# Patient Record
Sex: Female | Born: 1965 | Race: White | Hispanic: Yes | Marital: Married | Smoking: Never smoker
Health system: Southern US, Community
[De-identification: ages and names within clinical notes are randomized; demographics above are authoritative.]

## PROBLEM LIST (undated history)

## (undated) HISTORY — PX: TUBAL LIGATION: SHX77

---

## 2013-01-15 ENCOUNTER — Ambulatory Visit: Payer: Self-pay

## 2013-01-15 ENCOUNTER — Ambulatory Visit: Payer: Self-pay | Admitting: Family Medicine

## 2013-01-15 VITALS — BP 128/72 | HR 81 | Temp 98.0°F | Resp 17 | Ht 61.5 in | Wt 116.0 lb

## 2013-01-15 DIAGNOSIS — R1032 Left lower quadrant pain: Secondary | ICD-10-CM

## 2013-01-15 LAB — COMPREHENSIVE METABOLIC PANEL
AST: 15 U/L (ref 0–37)
Albumin: 4.5 g/dL (ref 3.5–5.2)
BUN: 16 mg/dL (ref 6–23)
Calcium: 9 mg/dL (ref 8.4–10.5)
Chloride: 101 mEq/L (ref 96–112)
Creat: 0.46 mg/dL — ABNORMAL LOW (ref 0.50–1.10)
Glucose, Bld: 84 mg/dL (ref 70–99)
Total Bilirubin: 0.4 mg/dL (ref 0.3–1.2)

## 2013-01-15 LAB — COMPREHENSIVE METABOLIC PANEL WITH GFR
ALT: 10 U/L (ref 0–35)
Alkaline Phosphatase: 52 U/L (ref 39–117)
CO2: 25 meq/L (ref 19–32)
Potassium: 4.5 meq/L (ref 3.5–5.3)
Sodium: 135 meq/L (ref 135–145)
Total Protein: 7.7 g/dL (ref 6.0–8.3)

## 2013-01-15 LAB — POCT CBC
Granulocyte percent: 59.5 % (ref 37–80)
HCT, POC: 39.1 % (ref 37.7–47.9)
Hemoglobin: 12.1 g/dL — AB (ref 12.2–16.2)
Lymph, poc: 2.1 (ref 0.6–3.4)
MCH, POC: 28.9 pg (ref 27–31.2)
MCHC: 30.9 g/dL — AB (ref 31.8–35.4)
MCV: 93.3 fL (ref 80–97)
MID (cbc): 0.5 (ref 0–0.9)
MPV: 10.7 fL (ref 0–99.8)
POC Granulocyte: 3.7 (ref 2–6.9)
POC LYMPH PERCENT: 33.3 %L (ref 10–50)
POC MID %: 7.2 % (ref 0–12)
Platelet Count, POC: 214 10*3/uL (ref 142–424)
RBC: 4.19 M/uL (ref 4.04–5.48)
RDW, POC: 14 %
WBC: 6.3 10*3/uL (ref 4.6–10.2)

## 2013-01-15 LAB — POCT UA - MICROSCOPIC ONLY
Bacteria, U Microscopic: NEGATIVE
Casts, Ur, LPF, POC: NEGATIVE
Crystals, Ur, HPF, POC: NEGATIVE
Mucus, UA: NEGATIVE
WBC, Ur, HPF, POC: NEGATIVE
Yeast, UA: NEGATIVE

## 2013-01-15 LAB — POCT URINALYSIS DIPSTICK
Bilirubin, UA: NEGATIVE
Glucose, UA: NEGATIVE
Ketones, UA: NEGATIVE
Leukocytes, UA: NEGATIVE
Nitrite, UA: NEGATIVE
Protein, UA: NEGATIVE
Spec Grav, UA: 1.025
Urobilinogen, UA: 0.2
pH, UA: 6

## 2013-01-15 NOTE — Progress Notes (Signed)
Chief Complaint:  Chief Complaint  Patient presents with  . Abdominal Pain  . Headache  . Back Pain    HPI: Michele Gallagher is a 48 y.o. female who is here for LLQ abd pain x 10 years, when the pain is very strong she feels nauseated but no vomitus. No fevers or chills or dysuria.  Last pap was a long time ago. She has never had a pap here, all her 4 children were born [place]. She has pain in LLQ abd pain when she has to strain, whenever she has a BM she feels better. She has had constipation regularly.  Denies any unintentional weight loss. Pain worse with constipation. The pain never goes away. She always shas pain there. Sometimes it is worse, never gets better.  She has a throbbing pain. It gets worse after eating, specially spicy food. She has constant pain, does not radiate. Rarely radiates to left leg and cannot walk about 2x per year.. Denies numbness or tingling.  Lower back also hurts on both sides. Sometimes she feels she might have kidney pain.  Ha due to being worried about pain   History reviewed. No pertinent past medical history. History reviewed. No pertinent past surgical history. History   Social History  . Marital Status: Single    Spouse Name: N/A    Number of Children: N/A  . Years of Education: N/A   Social History Main Topics  . Smoking status: Never Smoker   . Smokeless tobacco: None  . Alcohol Use: None  . Drug Use: None  . Sexual Activity: None   Other Topics Concern  . None   Social History Narrative  . None   History reviewed. No pertinent family history. No Known Allergies Prior to Admission medications   Not on File     ROS: The patient denies fevers, chills, night sweats, unintentional weight loss, chest pain, palpitations, wheezing, dyspnea on exertion, nausea, vomiting, dysuria, hematuria, melena, numbness, weakness, or tingling.   All other systems have been reviewed and were otherwise negative with the exception  of those mentioned in the HPI and as above.    PHYSICAL EXAM: Filed Vitals:   01/15/13 0916  BP: 128/72  Pulse: 81  Temp: 98 F (36.7 C)  Resp: 17   Filed Vitals:   01/15/13 0916  Height: 5' 1.5" (1.562 m)  Weight: 116 lb (52.617 kg)   Body mass index is 21.57 kg/(m^2).  General: Alert, no acute distress HEENT:  Normocephalic, atraumatic, oropharynx patent. EOMI, PERRLA Cardiovascular:  Regular rate and rhythm, no rubs murmurs or gallops.  No Carotid bruits, radial pulse intact. No pedal edema.  Respiratory: Clear to auscultation bilaterally.  No wheezes, rales, or rhonchi.  No cyanosis, no use of accessory musculature GI: No organomegaly, abdomen is soft and minimal to mild LLQ abd-tenderness, positive bowel sounds.  No masses. Skin: No rashes. Neurologic: Facial musculature symmetric. Psychiatric: Patient is appropriate throughout our interaction. Lymphatic: No cervical lymphadenopathy Musculoskeletal: Gait intact. Pelvic exam normal + LLQ abd pain  LABS: Results for orders placed in visit on 01/15/13  POCT CBC      Result Value Range   WBC 6.3  4.6 - 10.2 K/uL   Lymph, poc 2.1  0.6 - 3.4   POC LYMPH PERCENT 33.3  10 - 50 %L   MID (cbc) 0.5  0 - 0.9   POC MID % 7.2  0 - 12 %M   POC Granulocyte 3.7  2 - 6.9   Granulocyte percent 59.5  37 - 80 %G   RBC 4.19  4.04 - 5.48 M/uL   Hemoglobin 12.1 (*) 12.2 - 16.2 g/dL   HCT, POC 16.1  09.6 - 47.9 %   MCV 93.3  80 - 97 fL   MCH, POC 28.9  27 - 31.2 pg   MCHC 30.9 (*) 31.8 - 35.4 g/dL   RDW, POC 04.5     Platelet Count, POC 214  142 - 424 K/uL   MPV 10.7  0 - 99.8 fL  POCT UA - MICROSCOPIC ONLY      Result Value Range   WBC, Ur, HPF, POC neg     RBC, urine, microscopic 0-1     Bacteria, U Microscopic neg     Mucus, UA neg     Epithelial cells, urine per micros 3-5     Crystals, Ur, HPF, POC neg     Casts, Ur, LPF, POC neg     Yeast, UA neg    POCT URINALYSIS DIPSTICK      Result Value Range   Color, UA yellow      Clarity, UA clear     Glucose, UA neg     Bilirubin, UA neg     Ketones, UA neg     Spec Grav, UA 1.025     Blood, UA trace-lysed     pH, UA 6.0     Protein, UA neg     Urobilinogen, UA 0.2     Nitrite, UA neg     Leukocytes, UA Negative       EKG/XRAY:   Primary read interpreted by Dr. Conley Rolls at [facility]. No free air, no obvious stones, nonspecific gas patterns + mild stool burden   ASSESSMENT/PLAN: Encounter Diagnosis  Name Primary?  Marland Kitchen LLQ pain Yes   Miralax for possible constipation  Refer to HD for pap and STD testing If no improvement then consider pelvic US for ovarian related issues Fu prn  Gross sideeffects, risk and benefits, and alternatives of medications d/w patient. Patient is aware that all medications have potential sideeffects and we are unable to predict every sideeffect or drug-drug interaction that may occur.  Michele Pacitti PHUONG, DO 01/15/2013 10:34 AM

## 2013-01-15 NOTE — Patient Instructions (Addendum)
Dolor abdominal en las mujeres °(Abdominal Pain, Women) °El dolor abdominal (en el estómago, la pelvis o el vientre) puede tener muchas causas. Es importante que Michele Gallagher informe a su médico: °· La ubicación del dolor. °· ¿Viene y se va, o persiste todo el tiempo? °· ¿Hay situaciones que inician el dolor (comer ciertos alimentos, la actividad física)? °· ¿Tiene otros síntomas asociados al dolor (fiebre, náuseas, vómitos, diarrea)? °Todo es de gran ayuda cuando se trata de hallar la causa del dolor. °CAUSAS °· Estómago: Infecciones por virus o bacterias, o úlcera. °· Intestino: Apendicitis (apéndice inflamado), ileitis regional (enfermedad de Crohn), colitis ulcerosa (colon inflamado), síndrome del colon irritable, diverticulitis (inflamación de los divertículos del colon) o cáncer de estómago oo intestino. °· Enfermedades de la vesícula biliar o cálculos. °· Enfermedades renales, cálculos o infecciones en el riñón. °· Infección o cáncer del páncreas. °· Fibromialgia (trastorno doloroso) °· Enfermedades de los órganos femeninos: °¨ Uterus: Útero: fibroma (tumor no canceroso) o infección °¨ Trompas de Falopio: infección o embarazo ectópico °¨ En los ovarios, quistes o tumores. °¨ Adherencias pélvicas (tejido cicatrizal). °¨ Endometriosis (el tejido que cubre el útero se desarrolla en la pelvis y los órganos pélvicos). °¨ Síndrome de congestión pélvica (los órganos femeninos se llenan de sangre antes del periodo menstrual( °¨ Dolor durante el periodo menstrual. °¨ Dolor durante la ovulación (al producir óvulos). °¨ Dolor al usar el DIU (dispositivo intrauterino para el control de la natalidad) °¨ Cáncer en los órganos femeninos. °· Dolor funcional (no está originado en una enfermedad, puede mejorar sin tratamiento). °· Dolor de origen psicológico °· Depresión. °DIAGNÓSTICO °Su médico decidirá la gravedad del dolor a través del examen físico °· Análisis de sangre °· Radiografías °· Ecografías °· TC (tomografía computada, tipo  especial de radiografías). °· IMR (resonancia magnética) °· Cultivos, en el caso una infección °· Colon por enema de bario (se inserta una sustancia de contraste en el intestino grueso para mejorar la observación con rayos X.) °· Colonoscopía (observación del intestino con un tubo luminoso). °· Laparoscopía (examen del interior del abdomen con un tubo que tiene una luz). °· Cirugía exploratoria abdominal mayor (se observa el abdomen realizando una gran incisión). °TRATAMIENTO °El tratamiento dependerá de la causa del problema.  °· Muchos de estos casos pueden controlarse y tratarse en casa. °· Medicamentos de venta libre indicados por el médico. °· Medicamentos con receta. °· Antibióticos, en caso de infección °· Píldoras anticonceptivas, en el caso de períodos dolorosos o dolor al ovular. °· Tratamiento hormonal, para la endometriosis °· Inyecciones para bloqueo nervioso selectivo. °· Fisioterapia. °· Antidepresivos. °· Consejos por parte de un psícólogo o psiquiatra. °· Cirugía mayor o menor. °INSTRUCCIONES PARA EL CUIDADO DOMICILIARIO °· No tome ni administre laxantes a menos que se lo haya indicado su médico. °· Tome analgésicos de venta libre sólo si se lo ha indicado el profesional que lo asiste. No tome aspirina, ya que puede causar molestias en el estómago o hemorragias. °· Consuma una dieta líquida (caldo o agua) según lo indicado por el médico. Progrese lentamente a una dieta blanda, según la tolerancia, si el dolor se relaciona con el estómago o el intestino. °· Tenga un termómetro y tómese la temperatura varias veces al día. °· Haga reposo en la cama y duerma, si esto alivia el dolor. °· Evite las relaciones sexuales, si Michele Gallagher producen dolor. °· Evite las situaciones estresantes. °· Cumpla con las visitas y los análisis de control, según las indicaciones de su médico. °· Si el dolor   no se [employer] o la [place] tratar con:  Acupuntura.  Ejercicios de relajacin (yoga,  meditacin).  Terapia grupal.  Psicoterapia. SOLICITE ATENCIN MDICA SI:  Nota que ciertos Pharmacist, community de [place].  El tratamiento indicado para Arboriculturist no Marketing executive.  Necesita analgsicos ms fuertes.  Quiere que Mohan Erven retiren el DIU.  Si se siente confundido o desfalleciente.  Presenta nuseas o vmitos.  Aparece una erupcin cutnea.  Sufre efectos adversos o una reaccin alrgica debido a los medicamentos que toma. SOLICITE ATENCIN MDICA DE INMEDIATO SI:  El dolor persiste o se agrava.  Tiene fiebre.  Siente el dolor slo en algunos sectores del abdomen. Si se localiza en la zona derecha, posiblemente podra tratarse de apendicitis. En un adulto, si se localiza en la regin inferior izquierda del abdomen, podra tratarse de colitis o diverticulitis.  Hay sangre en las heces (deposiciones de color rojo brillante o negro alquitranado), con o sin vmitos.  Usted presenta sangre en la orina.  Siente escalofros con o sin fiebre.  Se desmaya. ASEGRESE QUE:   Comprende estas instrucciones.  Controlar su enfermedad.  Solicitar ayuda de inmediato si no mejora o si empeora. Document Released: 04/08/2008 Document Revised: 03/15/2011 Michele Gallagher Patient Information 2014 [place], [place].   RESOURCE GUIDE  Chronic Pain Problems:  Contact [facility] [phone]  Patients need to be referred by their primary care doctor.  Insufficient Money for Medicine:  Contact United Way: call [phone]  No Primary Care Doctor:  Call Health Connect [phone] - can help you locate a primary care doctor that accepts your insurance, provides certain services, etc.  Physician Referral Service- [phone] Agencies that provide inexpensive medical care:  [facility] [phone]  [facility] [phone]  [facility] [phone]  [facility] [phone]  Planned Parenthood [phone]    [facility] [phone] Medicaid-accepting [facility] Providers:  [facility]- [address], [phone], Mon-Fri 9am-7pm, Sat 9am-1pm  [facility]- [address] [place], Suite [place] [phone]  [facility]- [address] Suite [place] [phone]  [facility]- [address] [phone]  Renaye Rakers- [address] [place], [address], [phone] Only accepts [place] patients after they have their name applied to their card  Self Pay (no insurance) in [facility]:  Sickle Cell Patients - [facility] [address] [place], [phone]  [facility] Urgent Care- [address] [place] [phone]  [facility] [place]- [address]  - [facility]- see information above (Speak to [employer] if you do not have insurance)  - [facility]- [address], [phone]  - [facility]- [address] [phone]  - Dr Julio Sicks- [address], [place], [phone]  - [facility] [place] - [address] [phone]  - [facility]- [address] [phone], also [address], [phone]  - [facility]- [address] [place], [phone], 1st & 3rd Saturday  every month, 10am-1pm  - [facility]  [address] [place], [place].  Phone: [phone], Fax: [phone]. Hours of Operation: 9 am - 6 pm, M-F.  - [facility]  [address], [place]  Phone: [phone], Fax: [phone].  Hours of Operation: 8:30 am - 5:30 pm, M-F.  [facility]  [address]  [place], [place] [address]  [phone]  [facility]  [address]  [place], [place] [address]  [phone]) [phone]  1) Find a Doctor and Pay Out of Pocket  Although you won't have to find out who is covered by your  insurance plan, it is a good idea to ask around and get recommendations. You will then need to call the office and see if the doctor you have chosen will accept you as a new patient and what types of options they offer for patients who are self-pay. Some doctors offer discounts or will set up payment plans for their patients who do not have insurance, but you will need to ask so you aren't surprised when you get to your appointment.  2) Contact Your Local Health Department  Not all health departments have doctors that can see patients for sick visits, but many do, so it is worth a call to see if yours does. If you don't know where your local health department is, you can check in your phone book. The CDC also has a tool to help you locate your state's health department, and many state websites also have listings of all of their local health departments.  3) Find a Walk-in Clinic  If your illness is not likely to be very severe or complicated, you may want to try a walk in clinic. These are popping up all over the country in pharmacies, drugstores, and shopping centers. They're usually staffed by nurse practitioners or physician assistants that have been trained to treat common illnesses and complaints. They're usually fairly quick and inexpensive. However, if you have serious medical issues or chronic medical problems, these are probably not your best option  STD Testing  [facility] of [facility] [place], STD Clinic, [address], [place], phone [phone] or [phone]. Monday - Friday, call for an appointment.  [facility] of [facility] [place], STD Clinic, [place], [place], phone [phone] or [phone]. Monday - Friday, call for an appointment. Abuse/Neglect:  [facility] Child Abuse Hotline [phone](336) (514)275-6890  [facility] Child Abuse Hotline 719-660-3786(912)279-3228 (After Hours) Emergency Shelter: [facility] [phone](336) [phone]   Maternity Homes:  Room at the [place] [facility] [phone]) [phone]  [facility] [phone]) [phone] MRSA Hotline #: (302) 726-6606(603)526-9480  Dental Assistance  If unable to pay or uninsured, contact: [facility]. to become qualified for the adult dental clinic.  Patients with Medicaid: [facility]  [phone], [phone]  [address], [phone]  If unable to pay, or uninsured, contact [facility] [phone] in [place], [phone] in [facility]) to become qualified for the adult dental clinic  [facility]  [address]  [place], [place] [address]  [phone]  [email or web address]  Other ProofreaderLow-Cost Community Dental Services:  [facility]- [address], [place], [place], [address], [phone], Ext. [phone], 2nd and 4th Thursday of the month at 6:30am. 10 clients each day by appointment, can sometimes see walk-in patients if someone does not show for an appointment.  [facility]- [address], [place], [place], [address], [phone]  [facility]- [address], [place], [place], [address], [phone]  [facility]- [phone]  [facility]- [phone]  [facility] Health [phone]- [phone] Behavioral Health  Resources in the [employer]  Intensive Outpatient Programs:  [facility]  [address]  [place], [place]  [phone]  Both a day and evening program  [facility] Out[facility]  [address]  [place], [place] [address]  [phone]  ADS: Alcohol & Drug Svcs  [address]  [place]  [phone]  [facility]  ACCESS LINE: ([phone] or [phone]  [address]  [place], [place] [address]  [email or web address].[email or web address]  Substance Abuse Resources:  Alcohol and Drug Services 6145027091  [facility]  [phone]  The [place] [phone]  Floydene Flock [phone]  Residential & Outpatient Substance Abuse Program 2397595947 Psychological Services:  [facility] [phone]  [facility] [phone]  [facility], [phone] [place]. [address], [place], ACCESS LINE: (458)[phone] or ([phone], EntrepreneurLoan.[email or web address] Mobile Crisis Teams:  [facility]atives  Mobile Crisis Care Unit  (815)552-6983  Assertive  Psychotherapeutic Services  [address] Ginette Otto  ([phone]  Interventionist  [address] [place]  [address]  [place]  [phone]  Self-Help/Support Groups:  Mental Health Assoc. of [employer] of support groups  785-140-9604 (call for more info)  Narcotics Anonymous (NA)  Caring Services  [address]  [place] - 2 meetings at this location  Residential Treatment Programs:  [facility]  [address]  [place]  [phone]  [facility]  [address], [place] [address]86  [place], [place] [address]  [phone]  [facility]  [address] [place], [place] [address]  [phone]  Admissions: 8am-3pm M-F  [facility]  [address]  [place], [place] [address]  [phone]  [facility]  [address]  [place], [place]  [phone]  [facility]  [address]  [place], [place]  [phone]  [facility] - Intensive Outpatient  [address]  [place], [place]  [phone]  [facility] ([facility]  [address]  [place], [place]  [phone] or [phone]  Residential Treatment Services (RTS), Medicaid  [address]  [place], [place]  [phone]  Fellowship [address]  [address]  [place]  [phone]  [facility] Resources:  [phone]  General Therapy   Angie Fava, PhD  [address] [place], [place] [address]  [phone]  Insurance  [facility]al  [address]  [place], [place] [address]  [phone]  [facility]  [address] [place], [place] [address]  [phone]  Insurance/Medicaid/sponsorship through [facility]  [address]  [place], [place] [address]  Therapy/tele-psych/case  [phone]  [facility]  [address], [place] [address]  Adolescent/group home/case management  [phone]  Creola Corn PhD  General therapy  Insurance  [phone]  Dr. Lolly Mustache, [place], M-F  [phone]  [facility] [employer] [facility].  [address] [place] [address]  Blondell Reveal  Phone: [phone] Phone: [phone] Phone: [phone]  [facility], [phone]  [facility]aci- [phone] - [facility] in [place], [address],  [phone], Insurance  [place] Child Abuse Hotline  (208)616-5539 or [phone] (After Hours)

## 2013-02-22 ENCOUNTER — Other Ambulatory Visit: Payer: Self-pay

## 2014-10-08 IMAGING — CR DG ABDOMEN 1V
1 series · 1 of 1 positions shown · non-contrast
Comparison: None.

CLINICAL DATA: Lower abdominal pain

EXAM:
ABDOMEN - 1 VIEW

[AP]
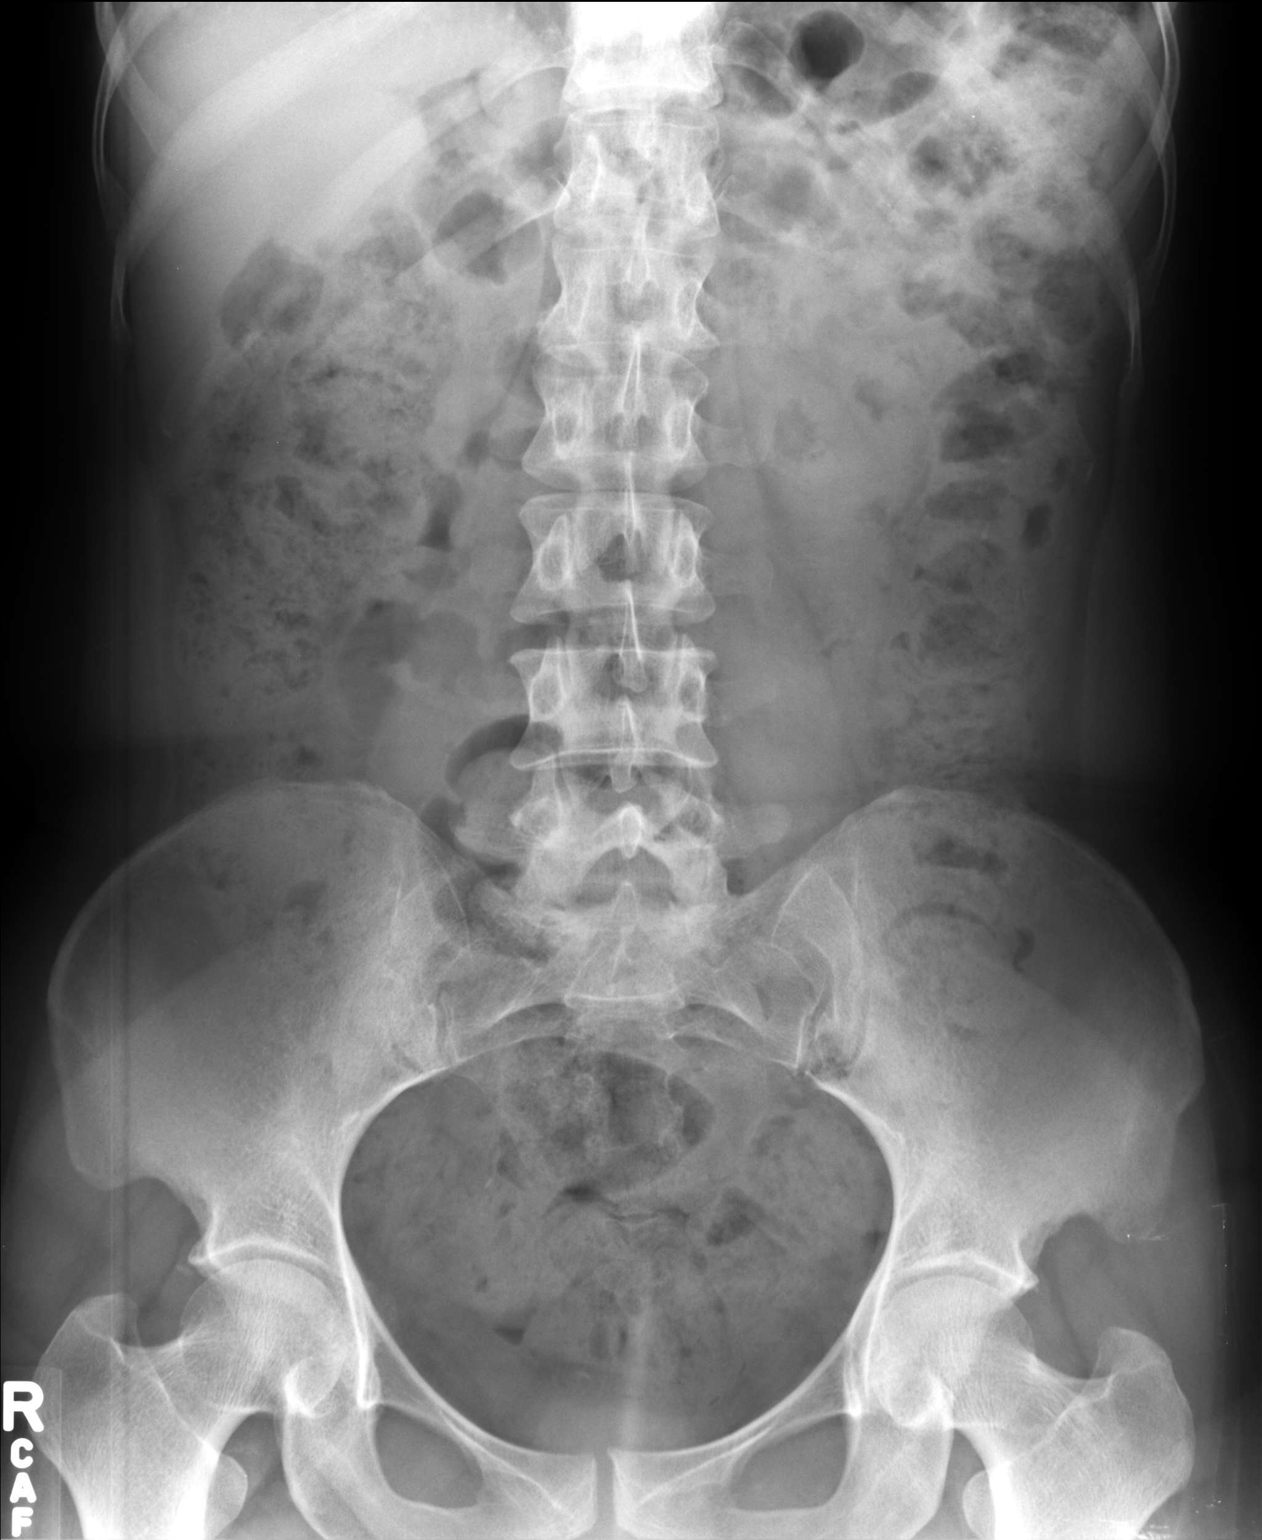

[1 of 1 positions shown; findings below may reference images not displayed]

FINDINGS: There is extensive stool throughout colon. The bowel gas pattern is
normal. No obstruction or free air is seen on this supine
examination. There are a few presumed vascular calcifications in the
pelvis.
IMPRESSION: Extensive stool throughout colon. Bowel gas pattern overall
unremarkable.

## 2018-02-10 ENCOUNTER — Other Ambulatory Visit: Payer: Self-pay

## 2018-02-10 ENCOUNTER — Encounter: Payer: Self-pay | Admitting: *Deleted

## 2018-02-10 ENCOUNTER — Emergency Department
Admission: EM | Admit: 2018-02-10 | Discharge: 2018-02-10 | Disposition: A | Discharge status: Home / Self Care | Payer: Self-pay | Source: Home / Self Care | Attending: Emergency Medicine | Admitting: Emergency Medicine

## 2018-02-10 DIAGNOSIS — M791 Myalgia, unspecified site: Secondary | ICD-10-CM

## 2018-02-10 DIAGNOSIS — R51 Headache: Secondary | ICD-10-CM

## 2018-02-10 DIAGNOSIS — G8929 Other chronic pain: Secondary | ICD-10-CM

## 2018-02-10 DIAGNOSIS — L709 Acne, unspecified: Secondary | ICD-10-CM

## 2018-02-10 DIAGNOSIS — R519 Headache, unspecified: Secondary | ICD-10-CM

## 2018-02-10 MED ORDER — CLINDAMYCIN PHOS-BENZOYL PEROX 1-5 % EX GEL: Freq: Two times a day (BID) | CUTANEOUS | 0 refills | Status: AC

## 2018-02-10 MED ORDER — DOXYCYCLINE HYCLATE 100 MG PO CAPS: 100.0000 mg | ORAL_CAPSULE | Freq: Two times a day (BID) | ORAL | 0 refills | Status: AC

## 2018-02-10 NOTE — ED Triage Notes (Signed)
Pt c/o intermittent LT eye swelling and bumps on her face x 2 mths. She has used Benzoyl/Clindamycin ointment with relief of the bumps, but she is out. She also c/o bilateral arm numbness and pain x 2-3 yrs.

## 2018-02-10 NOTE — ED Provider Notes (Signed)
[facility]    CSN: [id number] Arrival date & time: 02/10/18  1215     History   Chief Complaint Chief Complaint  Patient presents with  . Arm Pain  . Facial Swelling   Video interpretation services used.  Interpreter name Mort SawyersSalvador, [phone]#[id number] The history is provided by the patient and the spouse.   A language interpreter was used for the entire visit.  Patient has no PCP. Presents to [facility] with 2 separate problems. First, complains of an irritated mildly painful skin rash bilateral face, flares up intermittently for the past 3 years but particularly the past 2 to 3 months.  Never has blisters but sometimes has pimples filled with pus occasionally.  This flared up especially in the past couple of weeks but recalls no allergy him or any specific irritant.  Associated with irritation of left eyelid but no acute vision change.  Has mild nonspecific discomfort left eye.  After further questioning, she cannot specifically describe the discomfort left eye.  Denies any ear nose or throat symptoms.  No definite neck symptoms or pain.  She brings in a jar of clindamycin/benzoyl peroxide gel which she got from a friend and has tried using it in the past and it helped somewhat.  She requests a new prescription for this.  Denies fever chills nausea vomiting chest pain, shortness of breath, abdominal pain, GYN symptoms, UTI symptoms.   Second problem is complaint of generalized muscle pain bilateral arms and legs and occasionally left posterior neck muscles, has been going on for 2 or 3 years.  Denies any specific injury.  No definite weakness.  Occasionally her hands feel numb at night.  Not currently feeling numb.  No problems walking.  No syncope or seizures.  Otherwise no focal neurologic symptoms. HPI Michele Gallagher is a 53 y.o. female.    History reviewed. No pertinent past medical history. Denies any past medical history of chronic disease. There are no  active problems to display for this patient.   Past Surgical History:  Procedure Laterality Date  . TUBAL LIGATION     She denies chance of pregnancy as she is status post BTL in the past OB History   No obstetric history on file.      Home Medications    Prior to Admission medications   Medication Sig Start Date End Date Taking? Authorizing Provider  clindamycin-benzoyl peroxide (BENZACLIN) gel Apply topically 2 (two) times daily. 02/10/18   Lajean ManesMassey, , MD  doxycycline (VIBRAMYCIN) 100 MG capsule Take 1 capsule (100 mg total) by mouth 2 (two) times daily. For 10 days 02/10/18   Lajean ManesMassey, , MD    Family History History reviewed. No pertinent family history. No noted family history by patient and her husband Social History Social History   Tobacco Use  . Smoking status: Never Smoker  . Smokeless tobacco: Never Used  Substance Use Topics  . Alcohol use: Yes    Comment: beer  . Drug use: Never   Above social history reviewed  Allergies   Patient has no known allergies.   Review of Systems Review of Systems   Physical Exam Triage Vital Signs ED Triage Vitals  Enc Vitals Group     BP 02/10/18 1233 (!) 150/79     Pulse Rate 02/10/18 1233 69     Resp 02/10/18 1233 16     Temp 02/10/18 1233 (!) 97.4 F (36.3 C)     Temp Source 02/10/18 1233 Oral  SpO2 02/10/18 1233 98 %     Weight --      Height --      Head Circumference --      Peak Flow --      Pain Score 02/10/18 1234 5     Pain Loc --      Pain Edu? --      Excl. in GC? --    No data found.  Updated Vital Signs BP (!) 150/79 (BP Location: Right Arm)   Pulse 69   Temp (!) 97.4 F (36.3 C) (Oral)   Resp 16   LMP 01/30/2018   SpO2 98%   Visual Acuity Right Eye Distance:   Left Eye Distance:   Bilateral Distance:    Right Eye Near:   Left Eye Near:    Bilateral Near:     Physical Exam Vitals signs and nursing note reviewed.  Constitutional:      General: She is not in acute  distress.    Appearance: She is well-developed. She is not ill-appearing.  HENT:     Head: Normocephalic and atraumatic.     Right Ear: Tympanic membrane normal.     Left Ear: Tympanic membrane normal.     Nose: Nose normal.     Mouth/Throat:     Mouth: Mucous membranes are moist.  Eyes:     General: Vision grossly intact. No visual field deficit or scleral icterus.       Right eye: No discharge.        Left eye: No discharge.     Extraocular Movements: Extraocular movements intact.     Right eye: No nystagmus.     Left eye: No nystagmus.     Conjunctiva/sclera: Conjunctivae normal.     Right eye: Right conjunctiva is not injected. No exudate or hemorrhage.    Left eye: Left conjunctiva is not injected. No exudate or hemorrhage.    Pupils: Pupils are equal, round, and reactive to light.     Comments: There was 1 red pimple/comedone left eyelid, but eyelid otherwise normal.  No fluctuance or drainage. Remainder of exam of both eyes is within normal limits.  Conjunctiva normal, anterior chamber normal and funduscopic grossly intact bilaterally.  No foreign body or dendrites seen. Visual acuity grossly intact bilaterally  Neck:     Musculoskeletal: Normal range of motion and neck supple.     Thyroid: No thyroid mass.     Comments: No adenopathy or masses Cardiovascular:     Rate and Rhythm: Normal rate and regular rhythm.  Pulmonary:     Effort: Pulmonary effort is normal.  Abdominal:     General: There is no distension.  Musculoskeletal:     Right shoulder: She exhibits normal strength.     Comments: There is nonspecific tenderness of muscles of shoulders and upper extremities, but no bony tenderness.  Full range of motion of shoulders and elbows wrists and hands.  Handgrip normal.  DP pulse normal bilaterally.  Capillary refill normal bilaterally. Sensation normal hands and fingers. Hands and fingers normal temperature  Skin:    General: Skin is warm and dry.     Capillary  Refill: Capillary refill takes less than 2 seconds.     Findings: Acne (Facial acne with few red pustules, one on left eyelid, one on right forehead and several scattered on both facial cheeks.) and rash present. No bruising or laceration. Rash is not crusting, purpuric or vesicular.  Neurological:     Mental  Status: She is alert and oriented to person, place, and time.     Cranial Nerves: Cranial nerves are intact. No cranial nerve deficit.     Sensory: Sensation is intact. No sensory deficit.     Motor: Motor function is intact.     Coordination: Coordination is intact.     Gait: Gait is intact.  Psychiatric:        Behavior: Behavior normal.      UC Treatments / Results  Labs (all labs ordered are listed, but only abnormal results are displayed) Labs Reviewed - No data to display  EKG None  Radiology No results found.  Procedures Procedures (including critical care time)  Medications Ordered in UC Medications - No data to display  Initial Impression / Assessment and Plan / UC Course  I have reviewed the triage vital signs and the nursing notes.  Pertinent labs & imaging results that were available during my care of the patient were reviewed by me and considered in my medical decision making (see chart for details).      Final Clinical Impressions(s) / UC Diagnoses   Final diagnoses:  Muscle pain  Acne, unspecified acne type  Chronic facial pain  Over 45 minutes spent, greater than 50% of the time spent for counseling and coordination of care. Explained to patient through interpreter that facial rash is likely acne related.  No vesicles to suggest cold sores.  It is bilateral, so likely not shingles and there is no vesicles. Also, it is improved in the past with clindamycin/benzoyl peroxide gel, which is consistent with acne or this could be a variation of acne rosacea. I prescribed clindamycin/benzoyl peroxide gel as well as a 10-day course of doxycycline 100 mg  twice daily.  No refills.  Explained clearly that function of visit here in urgent care is acute visit and she needs to establish with and follow-up with PCP and we cannot treat ongoing chronic and recurrent problems here in urgent care.  She voiced understanding.  She has various muscle aches and pains but no bony tenderness and neurologic exam intact.  Advised ibuprofen, heat and other symptomatic care but urged her to follow-up with PCP.  After visit summary printed in Spanish and given to patient and husband. Through interpreter, questions invited and answered. We gave patient names and contact information for various local PCPs.   Discharge Instructions     For musculoskeletal pain, may try ibuprofen over-the-counter or heating pad.. For acne/skin problem on face, I sent 2 prescriptions to your pharmacy. You need to establish with primary care physician for follow-up in 7-10 days. It's crucially important that you have a primary care physician to see ongoing for medical problems. Also, you need to see an eye doctor for routine eye exam to see if you need glasses.    ED Prescriptions    Medication Sig Dispense Auth. Provider   clindamycin-benzoyl peroxide (BENZACLIN) gel Apply topically 2 (two) times daily. 50 g Lajean ManesMassey, , MD   doxycycline (VIBRAMYCIN) 100 MG capsule Take 1 capsule (100 mg total) by mouth 2 (two) times daily. For 10 days 20 capsule Lajean ManesMassey, , MD     Precautions discussed. Red flags discussed. Questions invited and answered. Patient and husband voiced understanding and agreement with all the above.    Lajean ManesMassey, , MD 02/10/18 [phone]

## 2018-02-10 NOTE — Discharge Instructions (Addendum)
For musculoskeletal pain, may try ibuprofen over-the-counter or heating pad.. For acne/skin problem on face, I sent 2 prescriptions to your pharmacy. You need to establish with primary care physician for follow-up in 7-10 days. It's crucially important that you have a primary care physician to see ongoing for medical problems. Also, you need to see an eye doctor for routine eye exam to see if you need glasses.
